# Patient Record
Sex: Female | Born: 1965 | Race: Black or African American | Hispanic: No | Marital: Single | Smoking: Current every day smoker
Health system: Southern US, Community
[De-identification: ages and names within clinical notes are randomized; demographics above are authoritative.]

## PROBLEM LIST (undated history)

## (undated) DIAGNOSIS — D649 Anemia, unspecified: Secondary | ICD-10-CM

## (undated) DIAGNOSIS — N390 Urinary tract infection, site not specified: Secondary | ICD-10-CM

## (undated) DIAGNOSIS — R011 Cardiac murmur, unspecified: Secondary | ICD-10-CM

## (undated) HISTORY — DX: Anemia, unspecified: D64.9

## (undated) HISTORY — DX: Cardiac murmur, unspecified: R01.1

---

## 2006-10-31 ENCOUNTER — Emergency Department (HOSPITAL_COMMUNITY): Admission: EM | Admit: 2006-10-31 | Discharge: 2006-10-31 | Payer: Self-pay | Admitting: Emergency Medicine

## 2009-09-20 ENCOUNTER — Emergency Department (HOSPITAL_COMMUNITY): Admission: EM | Admit: 2009-09-20 | Discharge: 2009-09-20 | Payer: Self-pay | Admitting: Family Medicine

## 2010-10-24 LAB — POCT URINALYSIS DIP (DEVICE)
Glucose, UA: NEGATIVE mg/dL
Ketones, ur: NEGATIVE mg/dL
Protein, ur: NEGATIVE mg/dL
Specific Gravity, Urine: <=1.005 (ref 1.005–1.030)

## 2010-10-24 LAB — URINE CULTURE: Colony Count: 85000

## 2010-10-24 LAB — POCT PREGNANCY, URINE: Preg Test, Ur: NEGATIVE

## 2011-07-15 ENCOUNTER — Emergency Department (INDEPENDENT_AMBULATORY_CARE_PROVIDER_SITE_OTHER): Payer: Medicaid Other

## 2011-07-15 ENCOUNTER — Emergency Department (INDEPENDENT_AMBULATORY_CARE_PROVIDER_SITE_OTHER)
Admission: EM | Admit: 2011-07-15 | Discharge: 2011-07-15 | Disposition: A | Discharge status: Home / Self Care | Payer: Medicaid Other | Source: Home / Self Care | Attending: Family Medicine | Admitting: Family Medicine

## 2011-07-15 ENCOUNTER — Encounter: Payer: Self-pay | Admitting: Cardiology

## 2011-07-15 DIAGNOSIS — J111 Influenza due to unidentified influenza virus with other respiratory manifestations: Secondary | ICD-10-CM

## 2011-07-15 DIAGNOSIS — R05 Cough: Secondary | ICD-10-CM

## 2011-07-15 DIAGNOSIS — N390 Urinary tract infection, site not specified: Secondary | ICD-10-CM

## 2011-07-15 DIAGNOSIS — F17200 Nicotine dependence, unspecified, uncomplicated: Secondary | ICD-10-CM

## 2011-07-15 DIAGNOSIS — R509 Fever, unspecified: Secondary | ICD-10-CM

## 2011-07-15 LAB — POCT URINALYSIS DIP (DEVICE)
Bilirubin Urine: NEGATIVE
Glucose, UA: NEGATIVE mg/dL
Nitrite: NEGATIVE
Specific Gravity, Urine: 1.02 (ref 1.005–1.030)
Urobilinogen, UA: 0.2 mg/dL (ref 0.0–1.0)
pH: 7 (ref 5.0–8.0)

## 2011-07-15 MED ORDER — SULFAMETHOXAZOLE-TMP DS 800-160 MG PO TABS: 1.0000 | ORAL_TABLET | Freq: Two times a day (BID) | ORAL | Status: AC

## 2011-07-15 NOTE — ED Notes (Signed)
Pt reports sore throat, cough, back ache, and  hurting and pressure on urination since Sunday 12/2. Denies fever.

## 2011-07-15 NOTE — ED Provider Notes (Signed)
History     CSN: [id number] Arrival date & time: 07/15/2011 11:13 AM   First MD Initiated Contact with Patient 07/15/11 1125      Chief Complaint  Patient presents with  . Sore Throat  . Cough  . Urinary Tract Infection    (Consider location/radiation/quality/duration/timing/severity/associated sxs/prior treatment) Patient is a 45 y.o. female presenting with URI. The history is provided by the patient.  URI The primary symptoms include sore throat and cough. Primary symptoms do not include fever or rash. The current episode started 3 to 5 days ago. This is a new problem. The problem has not changed since onset. Symptoms associated with the illness include congestion and rhinorrhea. Risk factors: smoker.    History reviewed. No pertinent past medical history.  History reviewed. No pertinent past surgical history.  Family History  Problem Relation Age of Onset  . Asthma Other     History  Substance Use Topics  . Smoking status: Current Everyday Smoker -- 0.5 packs/day  . Smokeless tobacco: Not on file  . Alcohol Use: Yes     occas    OB History    Grav Para Term Preterm Abortions TAB SAB Ect Mult Living                  Review of Systems  Constitutional: Negative for fever.  HENT: Positive for congestion, sore throat and rhinorrhea.   Respiratory: Positive for cough. Negative for shortness of breath.   Gastrointestinal: Negative.   Genitourinary: Positive for dysuria, urgency and frequency. Negative for pelvic pain.  Skin: Negative for rash.    Allergies  Review of patient's allergies indicates no known allergies.  Home Medications   Current Outpatient Rx  Name Route Sig Dispense Refill  . SULFAMETHOXAZOLE-TMP DS 800-160 MG PO TABS Oral Take 1 tablet by mouth 2 (two) times daily. 20 tablet 0    BP 124/81  Pulse 71  Temp(Src) 98.6 F (37 C) (Oral)  Resp 20  SpO2 100%  LMP 07/05/2011  Physical Exam  Nursing note and vitals  reviewed. Constitutional: She appears well-developed and well-nourished.  HENT:  Head: Normocephalic.  Right Ear: External ear normal.  Left Ear: External ear normal.  Mouth/Throat: Oropharynx is clear and moist.  Eyes: Conjunctivae and EOM are normal. Pupils are equal, round, and reactive to light.  Neck: Normal range of motion. Neck supple.  Cardiovascular: Normal rate, normal heart sounds and intact distal pulses.   Pulmonary/Chest: Effort normal. She has rhonchi in the right middle field and the right lower field.  Abdominal: Soft. Bowel sounds are normal. There is no tenderness. There is no guarding.  Lymphadenopathy:    She has no cervical adenopathy.    ED Course  Procedures (including critical care time)  Labs Reviewed  POCT URINALYSIS DIP (DEVICE) - Abnormal; Notable for the following:    Hgb urine dipstick MODERATE (*)    All other components within normal limits  POCT URINALYSIS DIPSTICK   Dg Chest 2 View  07/15/2011  *RADIOLOGY REPORT*  Clinical Data:  Cough, fever and smoker.  CHEST - 2 VIEW  Comparison: None  Findings: The heart size and mediastinal contours are within normal limits.  Both lungs are clear.  The visualized skeletal structures are unremarkable.  IMPRESSION: No active disease.  Original Report Authenticated By: Reola Calkins, M.D.     1. Bronchitis with flu   2. UTI (lower urinary tract infection)       MDM  X-rays reviewed and  report per radiologist. U/a blood--mod.        Barkley Bruns, MD 07/15/11 1215

## 2012-02-03 ENCOUNTER — Emergency Department (HOSPITAL_COMMUNITY)
Admission: EM | Admit: 2012-02-03 | Discharge: 2012-02-03 | Disposition: A | Discharge status: Home / Self Care | Payer: Medicaid Other | Attending: Emergency Medicine | Admitting: Emergency Medicine

## 2012-02-03 ENCOUNTER — Encounter (HOSPITAL_COMMUNITY): Payer: Self-pay | Admitting: Emergency Medicine

## 2012-02-03 DIAGNOSIS — R42 Dizziness and giddiness: Secondary | ICD-10-CM | POA: Insufficient documentation

## 2012-02-03 DIAGNOSIS — N390 Urinary tract infection, site not specified: Secondary | ICD-10-CM | POA: Insufficient documentation

## 2012-02-03 DIAGNOSIS — J3489 Other specified disorders of nose and nasal sinuses: Secondary | ICD-10-CM | POA: Insufficient documentation

## 2012-02-03 DIAGNOSIS — R0981 Nasal congestion: Secondary | ICD-10-CM

## 2012-02-03 HISTORY — DX: Urinary tract infection, site not specified: N39.0

## 2012-02-03 LAB — URINALYSIS, ROUTINE W REFLEX MICROSCOPIC
Bilirubin Urine: NEGATIVE
Glucose, UA: NEGATIVE mg/dL
Specific Gravity, Urine: 1.01 (ref 1.005–1.030)
Urobilinogen, UA: 0.2 mg/dL (ref 0.0–1.0)

## 2012-02-03 LAB — URINE MICROSCOPIC-ADD ON

## 2012-02-03 LAB — POCT I-STAT, CHEM 8
BUN: 6 mg/dL (ref 6–23)
Calcium, Ion: 1.21 mmol/L (ref 1.12–1.32)
Chloride: 104 mEq/L (ref 96–112)
Creatinine, Ser: 0.8 mg/dL (ref 0.50–1.10)
Glucose, Bld: 83 mg/dL (ref 70–99)
Potassium: 3.8 mEq/L (ref 3.5–5.1)

## 2012-02-03 MED ORDER — NITROFURANTOIN MONOHYD MACRO 100 MG PO CAPS: 100.0000 mg | ORAL_CAPSULE | Freq: Two times a day (BID) | ORAL | Status: AC

## 2012-02-03 NOTE — ED Provider Notes (Signed)
Medical screening examination/treatment/procedure(s) were performed by non-physician practitioner and as supervising physician I was immediately available for consultation/collaboration.   Vir Whetstine B. Bernette Mayers, MD 02/03/12 1433

## 2012-02-03 NOTE — ED Notes (Signed)
Pt reports 2 month history of having pressure in vaginal area and urinary frequency. Pt denies burning or odor with urination. Last week on Tuesday pt experienced feeling lightheaded and had left sided ear pain the weekend before.

## 2012-02-03 NOTE — ED Provider Notes (Signed)
History     CSN: [id number]  Arrival date & time 02/03/12  1105   First MD Initiated Contact with Patient 02/03/12 1243      Chief Complaint  Patient presents with  . Dizziness  . Otalgia  . Urinary Frequency    (Consider location/radiation/quality/duration/timing/severity/associated sxs/prior treatment) HPI  Patient presents to emergency department complaining of a one month history of some intermittent pressure and stinging around her urethral opening with urination. She denies fevers, chills, abdominal pain, nausea, vomiting, flank pain, or hematuria. She denies vaginal discharge, pelvic pain, or dyspareunia. Patient is also complaining of a one-week history of intermittent lightheadedness noting that 2 days ago she felt pain in her left ear that has waxed and waned. Patient states she is not currently feel lightheaded or have pain in her ear. She denies fevers, chills, recent illness, sore throat, cough, or chest pain. Patient taken nothing for symptoms prior to arrival. She denies aggravating or alleviating factors. She denies difficulty ambulating, headache, visual changes, or loss of coordination. Patient states she has no known medical problems and takes no medicines on regular basis.  Past Medical History  Diagnosis Date  . UTI (lower urinary tract infection)     Past Surgical History  Procedure Date  . Cesarean section     Family History  Problem Relation Age of Onset  . Asthma Other     History  Substance Use Topics  . Smoking status: Current Everyday Smoker -- 0.5 packs/day  . Smokeless tobacco: Not on file  . Alcohol Use: Yes     occas    OB History    Grav Para Term Preterm Abortions TAB SAB Ect Mult Living                  Review of Systems  All other systems reviewed and are negative.    Allergies  Review of patient's allergies indicates no known allergies.  Home Medications   Current Outpatient Rx  Name Route Sig Dispense Refill  . ADULT  MULTIVITAMIN W/MINERALS CH Oral Take 1 tablet by mouth daily.    Marland Kitchen SWEET OIL OIL Both Ears Place 1 drop into both ears once.      BP 133/86  Pulse 76  Temp 98.9 F (37.2 C) (Oral)  Resp 20  SpO2 100%  LMP 01/28/2012  Physical Exam  Nursing note and vitals reviewed. Constitutional: She is oriented to person, place, and time. She appears well-developed and well-nourished. No distress.  HENT:  Head: Normocephalic and atraumatic.  Right Ear: External ear normal.  Left Ear: External ear normal.  Mouth/Throat: No oropharyngeal exudate.       TM's normal bilaterally  Eyes: Conjunctivae and EOM are normal. Pupils are equal, round, and reactive to light.       No nystagums  Neck: Normal range of motion. Neck supple.  Cardiovascular: Normal rate, regular rhythm, normal heart sounds and intact distal pulses.  Exam reveals no gallop and no friction rub.   No murmur heard. Pulmonary/Chest: Effort normal and breath sounds normal. No respiratory distress. She has no wheezes. She has no rales. She exhibits no tenderness.  Abdominal: Soft. Bowel sounds are normal. She exhibits no distension and no mass. There is no tenderness. There is no rebound and no guarding.       No CVA TTP.   Genitourinary: No vaginal discharge found.       Normal external genitalia.   Musculoskeletal: Normal range of motion. She exhibits no edema  and no tenderness.  Neurological: She is alert and oriented to person, place, and time. No cranial nerve deficit. Coordination normal.  Skin: Skin is warm and dry. No rash noted. She is not diaphoretic. No erythema.  Psychiatric: She has a normal mood and affect.    ED Course  Procedures (including critical care time)  Labs Reviewed  URINALYSIS, ROUTINE W REFLEX MICROSCOPIC - Abnormal; Notable for the following:    Hgb urine dipstick MODERATE (*)     All other components within normal limits  URINE MICROSCOPIC-ADD ON  URINE CULTURE   No results found.   1. UTI (lower  urinary tract infection)   2. Sinus congestion       MDM  Patient's urine does not look obviously infected however we will send it for culture. Given that she is symptomatic I will treat for urinary tract infection. She has no signs or symptoms of pyelonephritis. She's afebrile nontoxic-appearing. Patient is complaining of intermittent lightheadedness and some pain in her left ear that waxes and wanes. She has no ataxia no neuro focal deficits. TMs normal bilaterally. Question sinus pressure and congestion causing the lightheadedness and a waxing waning pain in her left ear. I spoke with patient about symptomatic treatment. She voices understanding and is agreeable to close PCP follow up for recheck of ongoing symptoms but returning to ER for any emergent changing or worsening of symtpoms.         [place], [place] 02/03/12 1432

## 2012-02-04 LAB — URINE CULTURE

## 2013-01-06 ENCOUNTER — Emergency Department (HOSPITAL_COMMUNITY)
Admission: EM | Admit: 2013-01-06 | Discharge: 2013-01-06 | Disposition: A | Discharge status: Home / Self Care | Payer: Medicaid Other | Source: Home / Self Care | Attending: Emergency Medicine | Admitting: Emergency Medicine

## 2013-01-06 ENCOUNTER — Encounter (HOSPITAL_COMMUNITY): Payer: Self-pay | Admitting: Emergency Medicine

## 2013-01-06 DIAGNOSIS — K0889 Other specified disorders of teeth and supporting structures: Secondary | ICD-10-CM

## 2013-01-06 DIAGNOSIS — K089 Disorder of teeth and supporting structures, unspecified: Secondary | ICD-10-CM

## 2013-01-06 MED ORDER — TRAMADOL HCL 50 MG PO TABS: 50.0000 mg | ORAL_TABLET | Freq: Four times a day (QID) | ORAL | Status: DC | PRN

## 2013-01-06 MED ORDER — NAPROXEN 500 MG PO TABS: 500.0000 mg | ORAL_TABLET | Freq: Two times a day (BID) | ORAL | Status: DC

## 2013-01-06 NOTE — ED Provider Notes (Signed)
Medical screening examination/treatment/procedure(s) were performed by non-physician practitioner and as supervising physician I was immediately available for consultation/collaboration.  Leslee Home, M.D.  Reuben Likes, MD 01/06/13 2101

## 2013-01-06 NOTE — ED Notes (Signed)
Pt c/o dental pain x 3 days. Pt has taken tylenol with no relief.  Left and right back teeth are bad. Pt states pain is a throbbing sensation. Some mild swelling. Denies any other symptoms.

## 2013-01-06 NOTE — ED Provider Notes (Signed)
History     CSN: [id number]  Arrival date & time 01/06/13  1600   First MD Initiated Contact with Patient 01/06/13 1758      Chief Complaint  Patient presents with  . Dental Pain    left and right back tooth pain x 3 days.     (Consider location/radiation/quality/duration/timing/severity/associated sxs/prior treatment) HPI Comments: Pt has dental pain for a few days.  Her teeth broke about a year ago but she never went to dentist.  She called one and they couldn't see her until Tuesday so pt states they told her to come here.  No fever, chills, bad taste, swelling, or discharge   Patient is a 47 y.o. female presenting with tooth pain.  Dental Pain Associated symptoms: no facial swelling, no fever and no neck pain     Past Medical History  Diagnosis Date  . UTI (lower urinary tract infection)     Past Surgical History  Procedure Laterality Date  . Cesarean section      Family History  Problem Relation Age of Onset  . Asthma Other     History  Substance Use Topics  . Smoking status: Current Every Day Smoker -- 0.50 packs/day  . Smokeless tobacco: Not on file  . Alcohol Use: Yes     Comment: occas    OB History   Grav Para Term Preterm Abortions TAB SAB Ect Mult Living                  Review of Systems  Constitutional: Negative for fever and chills.  HENT: Positive for dental problem. Negative for facial swelling, neck pain and neck stiffness.   Eyes: Negative for visual disturbance.  Respiratory: Negative for cough and shortness of breath.   Cardiovascular: Negative for chest pain, palpitations and leg swelling.  Gastrointestinal: Negative for nausea, vomiting and abdominal pain.  Endocrine: Negative for polydipsia and polyuria.  Genitourinary: Negative for dysuria, urgency and frequency.  Musculoskeletal: Negative for myalgias and arthralgias.  Skin: Negative for rash.  Neurological: Negative for dizziness, weakness and light-headedness.    Allergies   Review of patient's allergies indicates no known allergies.  Home Medications   Current Outpatient Rx  Name  Route  Sig  Dispense  Refill  . Multiple Vitamin (MULTIVITAMIN WITH MINERALS) TABS   Oral   Take 1 tablet by mouth daily.         . naproxen (NAPROSYN) 500 MG tablet   Oral   Take 1 tablet (500 mg total) by mouth 2 (two) times daily.   60 tablet   0   . Olive Oil (SWEET OIL) OIL   Both Ears   Place 1 drop into both ears once.         . traMADol (ULTRAM) 50 MG tablet   Oral   Take 1 tablet (50 mg total) by mouth every 6 (six) hours as needed for pain.   30 tablet   0     BP 154/80  Pulse 63  Temp(Src) 98.7 F (37.1 C) (Oral)  Resp 16  SpO2 100%  LMP 01/02/2013  Physical Exam  Nursing note and vitals reviewed. Constitutional: She is oriented to person, place, and time. Vital signs are normal. She appears well-developed and well-nourished. No distress.  HENT:  Head: Atraumatic.  Mouth/Throat: Abnormal dentition. Dental caries present.  Eyes: EOM are normal. Pupils are equal, round, and reactive to light.  Cardiovascular: Normal rate, regular rhythm and normal heart sounds.  Exam reveals  no gallop and no friction rub.   No murmur heard. Pulmonary/Chest: Effort normal and breath sounds normal. No respiratory distress. She has no wheezes. She has no rales.  Abdominal: Soft. There is no tenderness.  Neurological: She is alert and oriented to person, place, and time. She has normal strength.  Skin: Skin is warm and dry. She is not diaphoretic.  Psychiatric: She has a normal mood and affect. Her behavior is normal. Judgment normal.    ED Course  Procedures (including critical care time)  Labs Reviewed - No data to display No results found.   1. Toothache       MDM   No evidence of infection or abscess.  Will treat for pain and refer to dentist     Meds ordered this encounter  Medications  . DISCONTD: naproxen (NAPROSYN) 500 MG tablet     Sig: Take 1 tablet (500 mg total) by mouth 2 (two) times daily.    Dispense:  60 tablet    Refill:  0  . DISCONTD: traMADol (ULTRAM) 50 MG tablet    Sig: Take 1 tablet (50 mg total) by mouth every 6 (six) hours as needed for pain.    Dispense:  30 tablet    Refill:  0  . naproxen (NAPROSYN) 500 MG tablet    Sig: Take 1 tablet (500 mg total) by mouth 2 (two) times daily.    Dispense:  60 tablet    Refill:  0  . traMADol (ULTRAM) 50 MG tablet    Sig: Take 1 tablet (50 mg total) by mouth every 6 (six) hours as needed for pain.    Dispense:  30 tablet    Refill:  0          Graylon Good, PA-C 01/06/13 1927

## 2014-09-20 ENCOUNTER — Emergency Department (INDEPENDENT_AMBULATORY_CARE_PROVIDER_SITE_OTHER)
Admission: EM | Admit: 2014-09-20 | Discharge: 2014-09-20 | Disposition: A | Discharge status: Home / Self Care | Payer: Self-pay | Source: Home / Self Care | Attending: Family Medicine | Admitting: Family Medicine

## 2014-09-20 ENCOUNTER — Encounter (HOSPITAL_COMMUNITY): Payer: Self-pay | Admitting: Emergency Medicine

## 2014-09-20 DIAGNOSIS — R220 Localized swelling, mass and lump, head: Secondary | ICD-10-CM

## 2014-09-20 DIAGNOSIS — K047 Periapical abscess without sinus: Secondary | ICD-10-CM

## 2014-09-20 MED ORDER — CLINDAMYCIN HCL 300 MG PO CAPS: 300.0000 mg | ORAL_CAPSULE | Freq: Three times a day (TID) | ORAL | Status: DC

## 2014-09-20 MED ORDER — DICLOFENAC SODIUM 50 MG PO TBEC: 50.0000 mg | DELAYED_RELEASE_TABLET | Freq: Two times a day (BID) | ORAL | Status: DC | PRN

## 2014-09-20 NOTE — Discharge Instructions (Signed)
Thank you for coming in today. Follow up with a Dentist ASAP.    Dental Abscess A dental abscess is a collection of infected fluid (pus) from a bacterial infection in the inner part of the tooth (pulp). It usually occurs at the end of the tooth's root.  CAUSES   Severe tooth decay.  Trauma to the tooth that allows bacteria to enter into the pulp, such as a broken or chipped tooth. SYMPTOMS   Severe pain in and around the infected tooth.  Swelling and redness around the abscessed tooth or in the mouth or face.  Tenderness.  Pus drainage.  Bad breath.  Bitter taste in the mouth.  Difficulty swallowing.  Difficulty opening the mouth.  Nausea.  Vomiting.  Chills.  Swollen neck glands. DIAGNOSIS   A medical and dental history will be taken.  An examination will be performed by tapping on the abscessed tooth.  X-rays may be taken of the tooth to identify the abscess. TREATMENT The goal of treatment is to eliminate the infection. You may be prescribed antibiotic medicine to stop the infection from spreading. A root canal may be performed to save the tooth. If the tooth cannot be saved, it may be pulled (extracted) and the abscess may be drained.  HOME CARE INSTRUCTIONS  Only take over-the-counter or prescription medicines for pain, fever, or discomfort as directed by your caregiver.  Rinse your mouth (gargle) often with salt water ( tsp salt in 8 oz [250 ml] of warm water) to relieve pain or swelling.  Do not drive after taking pain medicine (narcotics).  Do not apply heat to the outside of your face.  Return to your dentist for further treatment as directed. SEEK MEDICAL CARE IF:  Your pain is not helped by medicine.  Your pain is getting worse instead of better. SEEK IMMEDIATE MEDICAL CARE IF:  You have a fever or persistent symptoms for more than 2-3 days.  You have a fever and your symptoms suddenly get worse.  You have chills or a very bad  headache.  You have problems breathing or swallowing.  You have trouble opening your mouth.  You have swelling in the neck or around the eye. Document Released: 07/21/2005 Document Revised: 04/14/2012 Document Reviewed: 10/29/2010 [facility] Patient Information 2015 [place], [place]. This information is not intended to replace advice given to you by your health care provider. Make sure you discuss any questions you have with your health care provider.   Proofreader Services:  [facility] [phone] (ext [phone])  [id number]  Please call Dr. Lawrence Marseilles office [phone] or cell [phone] [address] [place]  Cost for tooth removal $200 includes exam, Xray, and extraction and follow up visit.  Bring list of current medications with you.   [facility] - [address] [phone]  [address] [facility]  [address] [place], [place], [place], [address]  [phone], Ext. [phone]  2nd and 4th Thursday of the month at 6:30am (Simple extractions only - no wisdom teeth or surgery) First come/First serve -First 10 clients served  [facility] [place], [place] and [place] residents only)  [address] [place], [place], [address]  336 [phone]  [facility] Department  336 [phone]  [facility] Health Department  [phone]  [facility] - [facility]  [phone]  Please call [facility] at [phone] to get the  details to get your tooth pulled.

## 2014-09-20 NOTE — ED Provider Notes (Signed)
Shelly Turner is a 49 y.o. female who presents to Urgent Care today for left facial swelling. Patient had a filling fall out of one of her upper left teeth a few months ago. She's had pain off and on over the past month worsening over the past 2 days. She developed upper left sided facial swelling and pain. No fevers or chills. No blurry vision or diplopia. No vomiting or diarrhea. Patient has tried some ibuprofen which helps.   Past Medical History  Diagnosis Date  . UTI (lower urinary tract infection)    Past Surgical History  Procedure Laterality Date  . Cesarean section     History  Substance Use Topics  . Smoking status: Current Every Day Smoker -- 0.50 packs/day  . Smokeless tobacco: Not on file  . Alcohol Use: Yes     Comment: occas   ROS as above Medications: No current facility-administered medications for this encounter.   Current Outpatient Prescriptions  Medication Sig Dispense Refill  . clindamycin (CLEOCIN) 300 MG capsule Take 1 capsule (300 mg total) by mouth 3 (three) times daily. 30 capsule 0  . diclofenac (VOLTAREN) 50 MG EC tablet Take 1 tablet (50 mg total) by mouth 2 (two) times daily as needed. 30 tablet 0  . Multiple Vitamin (MULTIVITAMIN WITH MINERALS) TABS Take 1 tablet by mouth daily.    Gracelyn Nurse. Olive Oil (SWEET OIL) OIL Place 1 drop into both ears once.     No Known Allergies   Exam:  BP 142/84 mmHg  Pulse 81  Temp(Src) 98.1 F (36.7 C) (Oral)  Resp 18  SpO2 98%  LMP 09/12/2014 Gen: Well NAD HEENT: EOMI,  MMM poor dentition throughout. Upper left premolar with dental caries. Tender to touch. Diffuse mild upper left facial swelling. Mildly tender.  Full eye range of motion. No blurry vision during EOM exam Lungs: Normal work of breathing. CTABL Heart: RRR no MRG Abd: NABS, Soft. Nondistended, Nontender Exts: Brisk capillary refill, warm and well perfused.   No results found for this or any previous visit (from the past 24 hour(s)). No results  found.  Assessment and Plan: [age] y.o. female with dental infection with facial infection. Treat with clindamycin and diclofenac. Follow-up with a dentist.  Discussed warning signs or symptoms. Please see discharge instructions. Patient expresses understanding.     Rodolph BongEvan S Malissa Slay, MD 09/20/14 [phone]

## 2014-09-20 NOTE — ED Notes (Signed)
C/o left side facial swelling onset yest am when she woke up Reports dental filling fell off and now having dental pain Dr. Denyse Amassorey is in room w/pt Alert, no signs of acute distress.

## 2017-12-12 ENCOUNTER — Emergency Department (HOSPITAL_COMMUNITY): Payer: Commercial Managed Care - PPO

## 2017-12-12 ENCOUNTER — Encounter (HOSPITAL_COMMUNITY): Payer: Self-pay

## 2017-12-12 ENCOUNTER — Emergency Department (HOSPITAL_COMMUNITY)
Admission: EM | Admit: 2017-12-12 | Discharge: 2017-12-12 | Disposition: A | Discharge status: Home / Self Care | Payer: Commercial Managed Care - PPO | Attending: Emergency Medicine | Admitting: Emergency Medicine

## 2017-12-12 ENCOUNTER — Other Ambulatory Visit: Payer: Self-pay

## 2017-12-12 DIAGNOSIS — M545 Low back pain, unspecified: Secondary | ICD-10-CM

## 2017-12-12 DIAGNOSIS — F17210 Nicotine dependence, cigarettes, uncomplicated: Secondary | ICD-10-CM | POA: Insufficient documentation

## 2017-12-12 DIAGNOSIS — M546 Pain in thoracic spine: Secondary | ICD-10-CM

## 2017-12-12 DIAGNOSIS — M549 Dorsalgia, unspecified: Secondary | ICD-10-CM | POA: Diagnosis present

## 2017-12-12 LAB — POC URINE PREG, ED: PREG TEST UR: NEGATIVE

## 2017-12-12 MED ORDER — ACETAMINOPHEN 500 MG PO TABS
1000.0000 mg | ORAL_TABLET | Freq: Once | ORAL | Status: AC
  Administered 2017-12-12: 1000 mg via ORAL
  Filled 2017-12-12: qty 2

## 2017-12-12 MED ORDER — IBUPROFEN 400 MG PO TABS
600.0000 mg | ORAL_TABLET | Freq: Once | ORAL | Status: AC
  Administered 2017-12-12: 600 mg via ORAL
  Filled 2017-12-12: qty 1

## 2017-12-12 MED ORDER — METHOCARBAMOL 500 MG PO TABS: 500.0000 mg | ORAL_TABLET | Freq: Two times a day (BID) | ORAL | 0 refills | Status: DC
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
## 2020-01-13 ENCOUNTER — Other Ambulatory Visit: Payer: Self-pay

## 2020-01-13 ENCOUNTER — Ambulatory Visit (INDEPENDENT_AMBULATORY_CARE_PROVIDER_SITE_OTHER): Payer: Commercial Managed Care - PPO

## 2020-01-13 ENCOUNTER — Encounter (HOSPITAL_COMMUNITY): Payer: Self-pay

## 2020-01-13 ENCOUNTER — Ambulatory Visit (HOSPITAL_COMMUNITY)
Admission: EM | Admit: 2020-01-13 | Discharge: 2020-01-13 | Disposition: A | Discharge status: Home / Self Care | Payer: Commercial Managed Care - PPO | Attending: Family Medicine | Admitting: Family Medicine

## 2020-01-13 DIAGNOSIS — M5010 Cervical disc disorder with radiculopathy, unspecified cervical region: Secondary | ICD-10-CM

## 2020-01-13 DIAGNOSIS — M542 Cervicalgia: Secondary | ICD-10-CM

## 2020-01-13 DIAGNOSIS — R202 Paresthesia of skin: Secondary | ICD-10-CM

## 2020-01-13 MED ORDER — TIZANIDINE HCL 4 MG PO TABS: 4.0000 mg | ORAL_TABLET | Freq: Four times a day (QID) | ORAL | 0 refills | Status: DC | PRN

## 2020-01-13 MED ORDER — MELOXICAM 15 MG PO TABS
15.0000 mg | ORAL_TABLET | Freq: Every day | ORAL | 0 refills | Status: DC
Start: 2020-01-13 — End: 2023-08-27

## 2020-01-13 MED ORDER — METHYLPREDNISOLONE 4 MG PO TBPK
ORAL_TABLET | ORAL | 0 refills | Status: DC
Start: 2020-01-13 — End: 2023-08-27

## 2020-01-13 NOTE — ED Provider Notes (Signed)
[facility]    CSN: [id number] Arrival date & time: 01/13/20  2979      History   Chief Complaint Chief Complaint  Patient presents with  . left arm/back tingling    HPI Shelly Turner is a 54 y.o. female.   HPI  Patient is here for neck pain and left arm numbness.  This has been going on for couple of months.  She has numbness that goes down her left arm to her middle 2 fingers long and ring, on the dorsum of the hand.  No numbness, no aching.  The pain comes and goes.  It feels like electric shock at times.  It is worse with use of arm and with certain movements of the neck.  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  Cannot always use good body mechanics.  There is no specific accident or injury.  She states that she had a similar episode about 2 years ago.  She was treated by a chiropractor and got better. Patient states she is otherwise in good health and on no medication   Past Medical History:  Diagnosis Date  . UTI (lower urinary tract infection)     There are no problems to display for this patient.   Past Surgical History:  Procedure Laterality Date  . CESAREAN SECTION      OB History   No obstetric history on file.   Patient has 3 living children.  Age 14, 64 and 84.  Home Medications    Prior to Admission medications   Medication Sig Start Date End Date Taking? Authorizing Provider  Multiple Vitamin (MULTIVITAMIN WITH MINERALS) TABS Take 1 tablet by mouth daily.    [provider]  Olive Oil (SWEET OIL) OIL Place 1 drop into both ears once.    [provider]    Family History Family History  Problem Relation Age of Onset  . Asthma Other     Social History Social History   Tobacco Use  . Smoking status: Current Every Day Smoker    Packs/day: 0.50  Substance Use Topics  . Alcohol use: Yes    Comment: occas  . Drug use: No     Allergies   Patient has no known allergies.   Review of  Systems Review of Systems  Musculoskeletal: Positive for neck pain and neck stiffness.     Physical Exam Triage Vital Signs ED Triage Vitals  Enc Vitals Group     BP 01/13/20 0930 (!) 148/86     Pulse Rate 01/13/20 0930 79     Resp 01/13/20 0930 16     Temp 01/13/20 0930 98.6 F (37 C)     Temp Source 01/13/20 0930 Oral     SpO2 01/13/20 0930 100 %     Weight 01/13/20 0931 173 lb (78.5 kg)     Height 01/13/20 0931 5\' 8"  (1.727 m)     Head Circumference --      Peak Flow --      Pain Score 01/13/20 0931 8     Pain Loc --      Pain Edu? --      Excl. in GC? --    No data found.  Updated Vital Signs BP (!) 148/86   Pulse 79   Temp 98.6 F (37 C) (Oral)   Resp 16   Ht 5\' 8"  (1.727 m)   Wt 78.5 kg   LMP 01/07/2020  SpO2 100%   BMI 26.30 kg/m   Physical Exam Constitutional:      General: She is in acute distress.     Appearance: She is well-developed and normal weight.     Comments: Appears uncomfortable.  Stiff guarded movements  HENT:     Head: Normocephalic and atraumatic.     Mouth/Throat:     Comments: Mask is in place Eyes:     Conjunctiva/sclera: Conjunctivae normal.     Pupils: Pupils are equal, round, and reactive to light.  Neck:     Comments: Tenderness of the left of the C6-7 region and on the medial border of the scapula.  Increased muscle tone in the left upper body the trapezius.  Full but slow range of motion of neck Cardiovascular:     Rate and Rhythm: Normal rate.  Pulmonary:     Effort: Pulmonary effort is normal. No respiratory distress.  Abdominal:     General: There is no distension.     Palpations: Abdomen is soft.  Musculoskeletal:        General: Normal range of motion.     Cervical back: Normal range of motion. Tenderness present.     Comments: Strength sensation range of motion reflexes normal in both upper extremities  Skin:    General: Skin is warm and dry.  Neurological:     General: No focal deficit present.     Mental  Status: She is alert.  Psychiatric:        Mood and Affect: Mood normal.        Behavior: Behavior normal.      UC Treatments / Results  Labs (all labs ordered are listed, but only abnormal results are displayed) Labs Reviewed - No data to display  EKG   Radiology DG Cervical Spine Complete  Result Date: 01/13/2020 CLINICAL DATA:  Left neck pain and radiculopathy over the last week. Left arm tingling. EXAM: CERVICAL SPINE - COMPLETE 4+ VIEW COMPARISON:  CT 12/08/2017 FINDINGS: Straightening of the normal cervical lordosis. Mild disc space narrowing with small endplate osteophytes at C3-4, C4-5 and C5-6. Moderate bony foraminal narrowing on sides at C4-5 and C5-6. IMPRESSION: Mid cervical spondylosis. The patient does have some bony foraminal narrowing on both sides at C4-5 and C5-6. Electronically Signed   By: Paulina Fusi M.D.   On: 01/13/2020 11:20    Procedures Procedures (including critical care time)  Medications Ordered in UC Medications - No data to display  Initial Impression / Assessment and Plan / UC Course  I have reviewed the triage vital signs and the nursing notes.  Pertinent labs & imaging results that were available during my care of the patient were reviewed by me and considered in my medical decision making (see chart for details).     Discussed x-ray results and cervical DJD with patient Final Clinical Impressions(s) / UC Diagnoses   Final diagnoses:  Cervical disc disorder with radiculopathy of cervical region     Discharge Instructions     Take the Medrol Dosepak as directed This is a steroid anti-inflammatory to reduce pinched nerve symptoms When you have completed the Medrol Dosepak start meloxicam once a day This is a nonsteroid anti-inflammatory to take daily Take the tizanidine as needed as muscle relaxer This is especially helpful at night Use ice or heat to painful neck area See sports medicine if you fail to improve    ED  Prescriptions    Medication Sig Dispense Auth. Provider  methylPREDNISolone (MEDROL DOSEPAK) 4 MG TBPK tablet TAD 21 tablet Raylene Everts, MD   meloxicam (MOBIC) 15 MG tablet Take 1 tablet (15 mg total) by mouth daily. 30 tablet Raylene Everts, MD   tiZANidine (ZANAFLEX) 4 MG tablet Take 1-2 tablets (4-8 mg total) by mouth every 6 (six) hours as needed for muscle spasms. 21 tablet Raylene Everts, MD     PDMP not reviewed this encounter.   Raylene Everts, MD 01/13/20 1126

## 2020-01-13 NOTE — ED Triage Notes (Signed)
PT c/o intermittent left arm and back tingling and left fingers got numbx2 mos, but has gotten more consistent in the last 2wks. Pt denies any other symptoms. 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

## 2020-01-13 NOTE — Discharge Instructions (Signed)
Take the Medrol Dosepak as directed This is a steroid anti-inflammatory to reduce pinched nerve symptoms When you have completed the Medrol Dosepak start meloxicam once a day This is a nonsteroid anti-inflammatory to take daily Take the tizanidine as needed as muscle relaxer This is especially helpful at night Use ice or heat to painful neck area See sports medicine if you fail to improve

## 2020-02-02 ENCOUNTER — Ambulatory Visit (INDEPENDENT_AMBULATORY_CARE_PROVIDER_SITE_OTHER): Payer: Commercial Managed Care - PPO | Admitting: Pediatrics

## 2020-02-02 ENCOUNTER — Other Ambulatory Visit: Payer: Self-pay

## 2020-02-02 VITALS — BP 134/90 | Ht 68.0 in

## 2020-02-02 DIAGNOSIS — M542 Cervicalgia: Secondary | ICD-10-CM | POA: Diagnosis not present

## 2020-02-02 MED ORDER — GABAPENTIN 100 MG PO CAPS
100.0000 mg | ORAL_CAPSULE | Freq: Every day | ORAL | 1 refills | Status: DC
Start: 2020-02-02 — End: 2023-08-27

## 2020-02-02 NOTE — Patient Instructions (Addendum)
You have pain caused from a nerve root getting compressed likely by a disc We will try a medication called gabapentin (neurontin) to see if this helps with your pain. Start to 100 mg before bed. You can increase to 200 mg in 5-7 days if you would like and then you can increase again to 300 mg 5-7 days after that. We hope this helps! The main thing that should help is PT! Return in 4-6 weeks  Cervical Radiculopathy  Cervical radiculopathy means that a nerve in the neck (a cervical nerve) is pinched or bruised. This can happen because of an injury to the cervical spine (vertebrae) in the neck, or as a normal part of getting older. This can cause pain or loss of feeling (numbness) that runs from your neck all the way down to your arm and fingers. Often, this condition gets better with rest. Treatment may be needed if the condition does not get better. What are the causes?  A neck injury.  A bulging disk in your spine.  Muscle movements that you cannot control (muscle spasms).  Tight muscles in your neck due to overuse.  Arthritis.  Breakdown in the bones and joints of the spine (spondylosis) due to getting older.  Bone spurs that form near the nerves in the neck. What are the signs or symptoms?  Pain. The pain may: ? Run from the neck to the arm and hand. ? Be very bad or irritating. ? Be worse when you move your neck.  Loss of feeling or tingling in your arm or hand.  Weakness in your arm or hand, in very bad cases. How is this treated? In many cases, treatment is not needed for this condition. With rest, the condition often gets better over time. If treatment is needed, options may include:  Wearing a soft neck collar (cervical collar) for short periods of time, as told by your doctor.  Doing exercises (physical therapy) to strengthen your neck muscles.  Taking medicines.  Having shots (injections) in your spine, in very bad cases.  Having surgery. This may be needed  if other treatments do not help. The type of surgery that is used depends on the cause of your condition. Follow these instructions at home: If you have a soft neck collar:  Wear it as told by your doctor. Remove it only as told by your doctor.  Ask your doctor if you can remove the collar for cleaning and bathing. If you are allowed to remove the collar for cleaning or bathing: ? Follow instructions from your doctor about how to remove the collar safely. ? Clean the collar by wiping it with mild soap and water and drying it completely. ? Take out any removable pads in the collar every 1-2 days. Wash them by hand with soap and water. Let them air-dry completely before you put them back in the collar. ? Check your skin under the collar for redness or sores. If you see any, tell your doctor. Managing pain      Take over-the-counter and prescription medicines only as told by your doctor.  If told, put ice on the painful area. ? If you have a soft neck collar, remove it as told by your doctor. ? Put ice in a plastic bag. ? Place a towel between your skin and the bag. ? Leave the ice on for 20 minutes, 2-3 times a day.  If using ice does not help, you can try using heat. Use the  heat source that your doctor recommends, such as a moist heat pack or a heating pad. ? Place a towel between your skin and the heat source. ? Leave the heat on for 20-30 minutes. ? Remove the heat if your skin turns bright red. This is very important if you are unable to feel pain, heat, or cold. You may have a greater risk of getting burned.  You may try a gentle neck and shoulder rub (massage). Activity  Rest as needed.  Return to your normal activities as told by your doctor. Ask your doctor what activities are safe for you.  Do exercises as told by your doctor or physical therapist.  Do not lift anything that is heavier than 10 lb (4.5 kg) until your doctor tells you that it is safe. General  instructions  Use a flat pillow when you sleep.  Do not drive while wearing a soft neck collar. If you do not have a soft neck collar, ask your doctor if it is safe to drive while your neck heals.  Ask your doctor if the medicine prescribed to you requires you to avoid driving or using heavy machinery.  Do not use any products that contain nicotine or tobacco, such as cigarettes, e-cigarettes, and chewing tobacco. These can delay healing. If you need help quitting, ask your doctor.  Keep all follow-up visits as told by your doctor. This is important. Contact a doctor if:  Your condition does not get better with treatment. Get help right away if:  Your pain gets worse and is not helped with medicine.  You lose feeling or feel weak in your hand, arm, face, or leg.  You have a high fever.  You have a stiff neck.  You cannot control when you poop or pee (have incontinence).  You have trouble with walking, balance, or talking. Summary  Cervical radiculopathy means that a nerve in the neck is pinched or bruised.  A nerve can get pinched from a bulging disk, arthritis, an injury to the neck, or other causes.  Symptoms include pain, tingling, or loss of feeling that goes from the neck into the arm or hand.  Weakness in your arm or hand can happen in very bad cases.  Treatment may include resting, wearing a soft neck collar, and doing exercises. You might need to take medicines for pain. In very bad cases, shots or surgery may be needed.

## 2020-02-02 NOTE — Progress Notes (Signed)
  Shelly Turner - 54 y.o. female MRN [id number]  Date of birth: [birth year]  SUBJECTIVE:   CC: left sided numbness, tingling, spasms  54 year old female presenting with numbness, tingling into left arm with accompanying muscle spasms.  She reports that she has had these symptoms for the past 4 to 5 months.  She was seen at an urgent care on 6/11 and had x-rays of her cervical spine that showed foraminal narrowing at C4-C5 and C5-C6.  She was diagnosed with cervicalgia and was given a prednisone Dosepak and tizanidine.  Patient reports that the prednisone helped a lot but the tizanidine does not help with muscle spasms.  She reports numbness and tingling into the dorsum of her third and fourth fingers as well as lateral arm pain and muscle spasms.  She also feels muscle spasms in her chest and shoulder.  She had similar symptoms of numbness and tingling about 12 years ago and this improved with manipulation by a chiropractor.  She has not noticed any weakness in her arm.  She was involved in [facility] in May 2019 that caused neck and back pain but no severe injury.    ROS: As above  PMHx - Updated and reviewed.  Contributory factors include: as above PSHx - Updated and reviewed.  Contributory factors include:  Negative FHx - Updated and reviewed.  Contributory factors include:  Negative Social Hx - Updated and reviewed. Contributory factors include: Negative Medications - reviewed   DATA REVIEWED: Urgent care records and cervical x-rays  PHYSICAL EXAM:  VS: BP:134/90  HR: bpm  TEMP: ( )  RESP:   HT:5\' 8"  (172.7 cm)   WT:   BMI:  PHYSICAL EXAM: Gen: NAD, alert, cooperative with exam, well-appearing HEENT: clear conjunctiva,  CV:  no edema, capillary refill brisk, normal rate Resp: non-labored Skin: no rashes, normal turgor  Neuro: no gross deficits.  Psych:  alert and oriented  Neck/Back: - Inspection: no gross deformity or asymmetry, swelling or ecchymosis - Palpation: no TTP along  spinous process, TTP along rhomboid and lateral trapezius with pressure point at  Lateral trapezius and deltoid-- very sensitive to touch - ROM: limited ROM of the cervical spine with neck extension, rotation, flexion- neck extension causes reproduction of numbness/tingling and cramping sensation in arm and chest - Strength: 5/5 wrist flexion, extension, biceps flexion. 5/5 triceps extension. OK sign, interosseus strength intact  - Neuro: numbness in C7 nerve distribution (lateral shoulder/dorsum forearm and dorsum of 3rd and 4th digits) - Special testing: positive slump test, positive spurling's   ASSESSMENT & PLAN:  54 year old female presenting with cervicalgia with left upper extremity radiculopathy, with symptoms mostly in the C7 dermatomal distribution.  She has a positive Spurling's likely from a disc impinging on cervical nerves.  [facility].  Will trial gabapentin to see if this helps with her pain and allows her to get some rest, starting with 100 mg nightly and increasing up to 300 mg if it is helping.  Instructed her to return in 4 weeks for reevaluation.  If she is not getting improvement, will obtain cervical MRI and possible epidural steroid injection for pain relief.

## 2020-04-09 ENCOUNTER — Encounter (HOSPITAL_COMMUNITY): Payer: Self-pay

## 2020-04-09 ENCOUNTER — Ambulatory Visit (HOSPITAL_COMMUNITY)
Admission: EM | Admit: 2020-04-09 | Discharge: 2020-04-09 | Disposition: A | Discharge status: Home / Self Care | Payer: Commercial Managed Care - PPO | Attending: Family Medicine | Admitting: Family Medicine

## 2020-04-09 ENCOUNTER — Other Ambulatory Visit: Payer: Self-pay

## 2020-04-09 DIAGNOSIS — J069 Acute upper respiratory infection, unspecified: Secondary | ICD-10-CM | POA: Diagnosis not present

## 2020-04-09 DIAGNOSIS — U071 COVID-19: Secondary | ICD-10-CM | POA: Insufficient documentation

## 2020-04-09 LAB — SARS CORONAVIRUS 2 (TAT 6-24 HRS): SARS Coronavirus 2: POSITIVE — AB

## 2020-04-09 NOTE — ED Triage Notes (Signed)
Pt is here with body aches and chills that started Saturday night, pt has taken Tylenol to relieve discomfort

## 2020-04-09 NOTE — ED Provider Notes (Signed)
[facility]    CSN: [id number] Arrival date & time: 04/09/20  0815      History   Chief Complaint Chief Complaint  Patient presents with  . Generalized Body Aches  . Chills    HPI Shelly Turner is a 54 y.o. female.   3 days of generalized body aches, headaches, chills, sweats, post nasal drainage, loss of appetite. Trying to stay hydrated, denies abdominal pain, N/V/D, rashes, cough, wheezing, SOB. Taking tylenol prn with minimal temporary relief. Daughter has a cold but otherwise no sick contacts known. Not UTD on COVID vaccines.      Past Medical History:  Diagnosis Date  . UTI (lower urinary tract infection)     There are no problems to display for this patient.   Past Surgical History:  Procedure Laterality Date  . CESAREAN SECTION      OB History   No obstetric history on file.      Home Medications    Prior to Admission medications   Medication Sig Start Date End Date Taking? Authorizing Provider  gabapentin (NEURONTIN) 100 MG capsule Take 1 capsule (100 mg total) by mouth at bedtime. 02/02/20   Marca Ancona, MD  meloxicam (MOBIC) 15 MG tablet Take 1 tablet (15 mg total) by mouth daily. 01/13/20   Eustace Moore, MD  methylPREDNISolone (MEDROL DOSEPAK) 4 MG TBPK tablet TAD Patient not taking: Reported on 02/02/2020 01/13/20   Eustace Moore, MD  Multiple Vitamin (MULTIVITAMIN WITH MINERALS) TABS Take 1 tablet by mouth daily.    [provider]  tiZANidine (ZANAFLEX) 4 MG tablet Take 1-2 tablets (4-8 mg total) by mouth every 6 (six) hours as needed for muscle spasms. 01/13/20   Eustace Moore, MD    Family History Family History  Problem Relation Age of Onset  . Asthma Other   . Asthma Mother   . Healthy Father     Social History Social History   Tobacco Use  . Smoking status: Current Every Day Smoker    Packs/day: 0.50  . Smokeless tobacco: Never Used  Substance Use Topics  . Alcohol use: Yes    Comment:  occas  . Drug use: No     Allergies   Patient has no known allergies.   Review of Systems Review of Systems PER HPI  Physical Exam Triage Vital Signs ED Triage Vitals  Enc Vitals Group     BP 04/09/20 0859 140/80     Pulse Rate 04/09/20 0859 98     Resp 04/09/20 0859 18     Temp 04/09/20 0859 99.1 F (37.3 C)     Temp Source 04/09/20 0859 Oral     SpO2 04/09/20 0859 100 %     Weight --      Height --      Head Circumference --      Peak Flow --      Pain Score 04/09/20 0857 0     Pain Loc --      Pain Edu? --      Excl. in GC? --    No data found.  Updated Vital Signs BP 140/80 (BP Location: Right Arm)   Pulse 98   Temp 99.1 F (37.3 C) (Oral)   Resp 18   LMP 03/28/2020   SpO2 100%   Visual Acuity Right Eye Distance:   Left Eye Distance:   Bilateral Distance:    Right Eye Near:   Left Eye Near:  Bilateral Near:     Physical Exam Vitals and nursing note reviewed.  Constitutional:      Appearance: Normal appearance.  HENT:     Head: Atraumatic.     Right Ear: Tympanic membrane and external ear normal.     Left Ear: Tympanic membrane and external ear normal.     Nose: Congestion present.     Mouth/Throat:     Mouth: Mucous membranes are moist.     Pharynx: Oropharynx is clear. No posterior oropharyngeal erythema.  Eyes:     Extraocular Movements: Extraocular movements intact.     Conjunctiva/sclera: Conjunctivae normal.  Cardiovascular:     Rate and Rhythm: Normal rate and regular rhythm.     Heart sounds: Normal heart sounds.  Pulmonary:     Effort: Pulmonary effort is normal.     Breath sounds: Normal breath sounds. No wheezing or rales.  Abdominal:     General: Bowel sounds are normal. There is no distension.     Palpations: Abdomen is soft.     Tenderness: There is no abdominal tenderness. There is no right CVA tenderness, left CVA tenderness or guarding.  Musculoskeletal:        General: Normal range of motion.     Cervical back:  Normal range of motion and neck supple.  Skin:    General: Skin is warm and dry.  Neurological:     Mental Status: She is alert and oriented to person, place, and time.  Psychiatric:        Mood and Affect: Mood normal.        Thought Content: Thought content normal.      UC Treatments / Results  Labs (all labs ordered are listed, but only abnormal results are displayed) Labs Reviewed  SARS CORONAVIRUS 2 (TAT 6-24 HRS)    EKG   Radiology No results found.  Procedures Procedures (including critical care time)  Medications Ordered in UC Medications - No data to display  Initial Impression / Assessment and Plan / UC Course  I have reviewed the triage vital signs and the nursing notes.  Pertinent labs & imaging results that were available during my care of the patient were reviewed by me and considered in my medical decision making (see chart for details).     Await COVID 19 results, isolation protocols reviewed for this. Note given stating she is awaiting test results. Discussed OTC remedies and supportive care, return precautions reviewed at length with patient agreeing to plan.   Final Clinical Impressions(s) / UC Diagnoses   Final diagnoses:  Viral URI   Discharge Instructions   None    ED Prescriptions    None     PDMP not reviewed this encounter.   Roosvelt Maser [place], [place] 04/09/20 [phone]

## 2020-04-10 ENCOUNTER — Telehealth: Payer: Self-pay | Admitting: Infectious Diseases

## 2020-04-10 NOTE — Telephone Encounter (Signed)
Called to Discuss with patient about Covid symptoms and the use of the monoclonal antibody infusion for those with mild to moderate Covid symptoms and at a high risk of hospitalization.     Pt appears to qualify for this infusion due to co-morbid conditions and/or a member of an at-risk group in accordance with the FDA Emergency Use Authorization.    Her body aches are better with the ibuprofen. Some altered sensation in sense of smell.   Estimated body mass index is 26.3 kg/m as calculated from the following:   Height as of 02/02/20: 5\' 8"  (1.727 m).   Weight as of 01/13/20: 173 lb (78.5 kg).  She also has high SVI score - will give her information and allow her time to read and make decision. Sx started 9/04 and eligible for infusion through 9/14

## 2020-05-07 ENCOUNTER — Encounter: Payer: Self-pay | Admitting: Emergency Medicine

## 2020-05-07 ENCOUNTER — Other Ambulatory Visit: Payer: Self-pay

## 2020-05-07 ENCOUNTER — Ambulatory Visit (INDEPENDENT_AMBULATORY_CARE_PROVIDER_SITE_OTHER): Payer: Commercial Managed Care - PPO | Admitting: Emergency Medicine

## 2020-05-07 VITALS — BP 136/70 | HR 85 | Temp 97.6°F | Resp 18 | Ht 68.0 in | Wt 178.2 lb

## 2020-05-07 DIAGNOSIS — Z1159 Encounter for screening for other viral diseases: Secondary | ICD-10-CM | POA: Diagnosis not present

## 2020-05-07 DIAGNOSIS — Z1231 Encounter for screening mammogram for malignant neoplasm of breast: Secondary | ICD-10-CM

## 2020-05-07 DIAGNOSIS — Z1211 Encounter for screening for malignant neoplasm of colon: Secondary | ICD-10-CM

## 2020-05-07 DIAGNOSIS — Z7689 Persons encountering health services in other specified circumstances: Secondary | ICD-10-CM

## 2020-05-07 DIAGNOSIS — K429 Umbilical hernia without obstruction or gangrene: Secondary | ICD-10-CM

## 2020-05-07 NOTE — Progress Notes (Signed)
Shelly Turner 54 y.o.   Chief Complaint  Patient presents with  . New Patient (Initial Visit)    Patient is here to establish care and would like to discuss Hernia that she has had for awhile but is starting to become painful again.    HISTORY OF PRESENT ILLNESS: This is a 54 y.o. female first visit to this office, here to establish care with me. No significant past medical history.  No chronic medical problems. Has history of umbilical hernia.  Needs surgical referral.  Denies pain, nausea or vomiting, or any associated symptoms. Health maintenance items discussed with patient, including need for mammogram and colonoscopy. Had Covid infection early last month, September.  Recovering well.  Asymptomatic at present time.  No complications. Has forms from work that need filled out. No other complaints or medical concerns today.  HPI   Prior to Admission medications   Medication Sig Start Date End Date Taking? Authorizing Provider  gabapentin (NEURONTIN) 100 MG capsule Take 1 capsule (100 mg total) by mouth at bedtime. 02/02/20  Yes Shelly Ancona, MD  meloxicam (MOBIC) 15 MG tablet Take 1 tablet (15 mg total) by mouth daily. 01/13/20  Yes Shelly Moore, MD  Multiple Vitamin (MULTIVITAMIN WITH MINERALS) TABS Take 1 tablet by mouth daily.   Yes [provider]  tiZANidine (ZANAFLEX) 4 MG tablet Take 1-2 tablets (4-8 mg total) by mouth every 6 (six) hours as needed for muscle spasms. 01/13/20  Yes Shelly Moore, MD  methylPREDNISolone (MEDROL DOSEPAK) 4 MG TBPK tablet TAD Patient not taking: Reported on 02/02/2020 01/13/20   Shelly Moore, MD    Allergies  Allergen Reactions  . Shellfish Allergy Shortness Of Breath and Swelling    There are no problems to display for this patient.   Past Medical History:  Diagnosis Date  . Anemia    Phreesia 05/06/2020  . Heart murmur    [employer]reesia 05/06/2020  . UTI (lower urinary tract infection)     Past Surgical  History:  Procedure Laterality Date  . CESAREAN SECTION    . CESAREAN SECTION N/A    [employer]reesia 05/06/2020    Social History   Socioeconomic History  . Marital status: Single    Spouse name: Not on file  . Number of children: Not on file  . Years of education: Not on file  . Highest education level: Not on file  Occupational History  . Not on file  Tobacco Use  . Smoking status: Current Every Day Smoker    Packs/day: 0.50  . Smokeless tobacco: Never Used  Substance and Sexual Activity  . Alcohol use: Yes    Comment: occas  . Drug use: No  . Sexual activity: Yes    Birth control/protection: None  Other Topics Concern  . Not on file  Social History Narrative  . Not on file   Social Determinants of Health   Financial Resource Strain:   . Difficulty of Paying Living Expenses: Not on file  Food Insecurity:   . Worried About Programme researcher, broadcasting/film/video in the Last Year: Not on file  . Ran Out of Food in the Last Year: Not on file  Transportation Needs:   . Lack of Transportation (Medical): Not on file  . Lack of Transportation (Non-Medical): Not on file  Physical Activity:   . Days of Exercise per Week: Not on file  . Minutes of Exercise per Session: Not on file  Stress:   . Feeling of Stress :  Not on file  Social Connections:   . Frequency of Communication with Friends and Family: Not on file  . Frequency of Social Gatherings with Friends and Family: Not on file  . Attends Religious Services: Not on file  . Active Member of Clubs or Organizations: Not on file  . Attends Banker Meetings: Not on file  . Marital Status: Not on file  Intimate Partner Violence:   . Fear of Current or Ex-Partner: Not on file  . Emotionally Abused: Not on file  . Physically Abused: Not on file  . Sexually Abused: Not on file    Family History  Problem Relation Age of Onset  . Asthma Other   . Asthma Mother   . Healthy Father      Review of Systems  Constitutional:  Negative.  Negative for chills and fever.  HENT: Negative.  Negative for congestion and sore throat.   Respiratory: Negative.  Negative for cough and shortness of breath.   Cardiovascular: Negative.  Negative for chest pain and palpitations.  Genitourinary: Negative.  Negative for dysuria and hematuria.  Skin: Negative.  Negative for rash.  Neurological: Negative.  Negative for dizziness and headaches.  All other systems reviewed and are negative.  Vitals:   05/07/20 1611  BP: 136/70  Pulse: 85  Resp: 18  Temp: 97.6 F (36.4 C)  SpO2: 99%     Physical Exam Vitals reviewed.  Constitutional:      Appearance: Normal appearance.  HENT:     Head: Normocephalic.  Eyes:     Extraocular Movements: Extraocular movements intact.     Pupils: Pupils are equal, round, and reactive to light.  Cardiovascular:     Rate and Rhythm: Normal rate and regular rhythm.     Pulses: Normal pulses.     Heart sounds: Normal heart sounds.  Pulmonary:     Effort: Pulmonary effort is normal.     Breath sounds: Normal breath sounds.  Abdominal:     General: Bowel sounds are normal. There is no distension.     Palpations: Abdomen is soft.     Tenderness: There is no abdominal tenderness.     Hernia: A hernia (Umbilical) is present.  Musculoskeletal:        General: Normal range of motion.     Cervical back: Normal range of motion and neck supple.  Skin:    General: Skin is warm and dry.  Neurological:     General: No focal deficit present.     Mental Status: She is alert and oriented to person, place, and time.  Psychiatric:        Mood and Affect: Mood normal.        Behavior: Behavior normal.      ASSESSMENT & PLAN: Shelly Turner was seen today for new patient (initial visit).  Diagnoses and all orders for this visit:  Umbilical hernia without obstruction and without gangrene -     Ambulatory referral to General Surgery  Colon cancer screening -     Ambulatory referral to  Gastroenterology  Encounter for screening mammogram for malignant neoplasm of breast -     MM Digital Diagnostic Bilat; Future  Need for hepatitis C screening test -     Hepatitis C antibody  Encounter to establish care    Patient Instructions       If you have lab work done today you will be contacted with your lab results within the next 2 weeks.  If you  have not heard from [place] then please contact us. The fastest way to get your results is to register for My Chart.   IF you received an x-ray today, you will receive an invoice from [facility]. Please contact [facility] at [phone] with questions or concerns regarding your invoice.   IF you received labwork today, you will receive an invoice from [facility]. Please contact [facility] at [phone] with questions or concerns regarding your invoice.   Our billing staff will not be able to assist you with questions regarding bills from these companies.  You will be contacted with the lab results as soon as they are available. The fastest way to get your results is to activate your My Chart account. Instructions are located on the last page of this paperwork. If you have not heard from [place] regarding the results in 2 weeks, please contact this office.      Health Maintenance, Female Adopting a healthy lifestyle and getting preventive care are important in promoting health and wellness. Ask your health care provider about:  The right schedule for you to have regular tests and exams.  Things you can do on your own to prevent diseases and keep yourself healthy. What should I know about diet, weight, and exercise? Eat a healthy diet   Eat a diet that includes plenty of vegetables, fruits, low-fat dairy products, and lean protein.  Do not eat a lot of foods that are high in solid fats, added sugars, or sodium. Maintain a healthy weight Body mass index (BMI) is used to identify weight problems. It estimates body  fat based on height and weight. Your health care provider can help determine your BMI and help you achieve or maintain a healthy weight. Get regular exercise Get regular exercise. This is one of the most important things you can do for your health. Most adults should:  Exercise for at least 150 minutes each week. The exercise should increase your heart rate and make you sweat (moderate-intensity exercise).  Do strengthening exercises at least twice a week. This is in addition to the moderate-intensity exercise.  Spend less time sitting. Even light physical activity can be beneficial. Watch cholesterol and blood lipids Have your blood tested for lipids and cholesterol at 54 years of age, then have this test every 5 years. Have your cholesterol levels checked more often if:  Your lipid or cholesterol levels are high.  You are older than 53 years of age.  You are at high risk for heart disease. What should I know about cancer screening? Depending on your health history and family history, you may need to have cancer screening at various ages. This may include screening for:  Breast cancer.  Cervical cancer.  Colorectal cancer.  Skin cancer.  Lung cancer. What should I know about heart disease, diabetes, and high blood pressure? Blood pressure and heart disease  High blood pressure causes heart disease and increases the risk of stroke. This is more likely to develop in people who have high blood pressure readings, are of African descent, or are overweight.  Have your blood pressure checked: ? Every 3-5 years if you are 53-43 years of age. ? Every year if you are 35 years old or older. Diabetes Have regular diabetes screenings. This checks your fasting blood sugar level. Have the screening done:  Once every three years after age 39 if you are at a normal weight and have a low risk for diabetes.  More often and at a younger  age if you are overweight or have a high risk for  diabetes. What should I know about preventing infection? Hepatitis B If you have a higher risk for hepatitis B, you should be screened for this virus. Talk with your health care provider to find out if you are at risk for hepatitis B infection. Hepatitis C Testing is recommended for:  Everyone born from [birth year] through 1964.  Anyone with known risk factors for hepatitis C. Sexually transmitted infections (STIs)  Get screened for STIs, including gonorrhea and chlamydia, if: ? You are sexually active and are younger than 53 years of age. ? You are older than 53 years of age and your health care provider tells you that you are at risk for this type of infection. ? Your sexual activity has changed since you were last screened, and you are at increased risk for chlamydia or gonorrhea. Ask your health care provider if you are at risk.  Ask your health care provider about whether you are at high risk for HIV. Your health care provider may recommend a prescription medicine to help prevent HIV infection. If you choose to take medicine to prevent HIV, you should first get tested for HIV. You should then be tested every 3 months for as long as you are taking the medicine. Pregnancy  If you are about to stop having your period (premenopausal) and you may become pregnant, seek counseling before you get pregnant.  Take 400 to 800 micrograms (mcg) of folic acid every day if you become pregnant.  Ask for birth control (contraception) if you want to prevent pregnancy. Osteoporosis and menopause Osteoporosis is a disease in which the bones lose minerals and strength with aging. This can result in bone fractures. If you are 77 years old or older, or if you are at risk for osteoporosis and fractures, ask your health care provider if you should:  Be screened for bone loss.  Take a calcium or vitamin D supplement to lower your risk of fractures.  Be given hormone replacement therapy (HRT) to treat symptoms of  menopause. Follow these instructions at home: Lifestyle  Do not use any products that contain nicotine or tobacco, such as cigarettes, e-cigarettes, and chewing tobacco. If you need help quitting, ask your health care provider.  Do not use street drugs.  Do not share needles.  Ask your health care provider for help if you need support or information about quitting drugs. Alcohol use  Do not drink alcohol if: ? Your health care provider tells you not to drink. ? You are pregnant, may be pregnant, or are planning to become pregnant.  If you drink alcohol: ? Limit how much you use to 0-1 drink a day. ? Limit intake if you are breastfeeding.  Be aware of how much alcohol is in your drink. In the [place] one drink equals one 12 oz bottle of beer (355 mL), one 5 oz glass of wine (148 mL), or one 1 oz glass of hard liquor (44 mL). General instructions  Schedule regular health, dental, and eye exams.  Stay current with your vaccines.  Tell your health care provider if: ? You often feel depressed. ? You have ever been abused or do not feel safe at home. Summary  Adopting a healthy lifestyle and getting preventive care are important in promoting health and wellness.  Follow your health care provider's instructions about healthy diet, exercising, and getting tested or screened for diseases.  Follow your health care provider's instructions on  monitoring your cholesterol and blood pressure. This information is not intended to replace advice given to you by your health care provider. Make sure you discuss any questions you have with your health care provider. Document Revised: 07/14/2018 Document Reviewed: 07/14/2018 Elsevier Patient Education  2020 Else[employer] Inc.  Umbilical Hernia, Adult  A hernia is a bulge of tissue that pushes through an opening between muscles. An umbilical hernia happens in the abdomen, near the belly button (umbilicus). The hernia may contain tissues from the  small intestine, large intestine, or fatty tissue covering the intestines (omentum). Umbilical hernias in adults tend to get worse over time, and they require surgical treatment. There are several types of umbilical hernias. You may have:  A hernia located just above or below the umbilicus (indirect hernia). This is the most common type of umbilical hernia in adults.  A hernia that forms through an opening formed by the umbilicus (direct hernia).  A hernia that comes and goes (reducible hernia). A reducible hernia may be visible only when you strain, lift something heavy, or cough. This type of hernia can be pushed back into the abdomen (reduced).  A hernia that traps abdominal tissue inside the hernia (incarcerated hernia). This type of hernia cannot be reduced.  A hernia that cuts off blood flow to the tissues inside the hernia (strangulated hernia). The tissues can start to die if this happens. This type of hernia requires emergency treatment. What are the causes? An umbilical hernia happens when tissue inside the abdomen presses on a weak area of the abdominal muscles. What increases the risk? You may have a greater risk of this condition if you:  Are obese.  Have had several pregnancies.  Have a buildup of fluid inside your abdomen (ascites).  Have had surgery that weakens the abdominal muscles. What are the signs or symptoms? The main symptom of this condition is a painless bulge at or near the belly button. A reducible hernia may be visible only when you strain, lift something heavy, or cough. Other symptoms may include:  Dull pain.  A feeling of pressure. Symptoms of a strangulated hernia may include:  Pain that gets increasingly worse.  Nausea and vomiting.  Pain when pressing on the hernia.  Skin over the hernia becoming red or purple.  Constipation.  Blood in the stool. How is this diagnosed? This condition may be diagnosed based on:  A physical exam. You may  be asked to cough or strain while standing. These actions increase the pressure inside your abdomen and force the hernia through the opening in your muscles. Your health care provider may try to reduce the hernia by pressing on it.  Your symptoms and medical history. How is this treated? Surgery is the only treatment for an umbilical hernia. Surgery for a strangulated hernia is done as soon as possible. If you have a small hernia that is not incarcerated, you may need to lose weight before having surgery. Follow these instructions at home:  Lose weight, if told by your health care provider.  Do not try to push the hernia back in.  Watch your hernia for any changes in color or size. Tell your health care provider if any changes occur.  You may need to avoid activities that increase pressure on your hernia.  Do not lift anything that is heavier than 10 lb (4.5 kg) until your health care provider says that this is safe.  Take over-the-counter and prescription medicines only as told by your health  care provider.  Keep all follow-up visits as told by your health care provider. This is important. Contact a health care provider if:  Your hernia gets larger.  Your hernia becomes painful. Get help right away if:  You develop sudden, severe pain near the area of your hernia.  You have pain as well as nausea or vomiting.  You have pain and the skin over your hernia changes color.  You develop a fever. This information is not intended to replace advice given to you by your health care provider. Make sure you discuss any questions you have with your health care provider. Document Revised: 09/02/2017 Document Reviewed: 01/19/2017 Elsevier Patient Education  2020 Elsevier Inc.      Edwina Barth, MD [facility]

## 2020-05-07 NOTE — Patient Instructions (Addendum)
   If you have lab work done today you will be contacted with your lab results within the next 2 weeks.  If you have not heard from us then please contact us. The fastest way to get your results is to register for My Chart.   IF you received an x-ray today, you will receive an invoice from [facility]. Please contact [facility] at [phone] with questions or concerns regarding your invoice.   IF you received labwork today, you will receive an invoice from [facility]. Please contact [facility] at [phone] with questions or concerns regarding your invoice.   Our billing staff will not be able to assist you with questions regarding bills from these companies.  You will be contacted with the lab results as soon as they are available. The fastest way to get your results is to activate your My Chart account. Instructions are located on the last page of this paperwork. If you have not heard from us regarding the results in 2 weeks, please contact this office.      Health Maintenance, Female Adopting a healthy lifestyle and getting preventive care are important in promoting health and wellness. Ask your health care provider about:  The right schedule for you to have regular tests and exams.  Things you can do on your own to prevent diseases and keep yourself healthy. What should I know about diet, weight, and exercise? Eat a healthy diet   Eat a diet that includes plenty of vegetables, fruits, low-fat dairy products, and lean protein.  Do not eat a lot of foods that are high in solid fats, added sugars, or sodium. Maintain a healthy weight Body mass index (BMI) is used to identify weight problems. It estimates body fat based on height and weight. Your health care provider can help determine your BMI and help you achieve or maintain a healthy weight. Get regular exercise Get regular exercise. This is one of the most important things you can do for your health. Most  adults should:  Exercise for at least 150 minutes each week. The exercise should increase your heart rate and make you sweat (moderate-intensity exercise).  Do strengthening exercises at least twice a week. This is in addition to the moderate-intensity exercise.  Spend less time sitting. Even light physical activity can be beneficial. Watch cholesterol and blood lipids Have your blood tested for lipids and cholesterol at 53 years of age, then have this test every 5 years. Have your cholesterol levels checked more often if:  Your lipid or cholesterol levels are high.  You are older than 53 years of age.  You are at high risk for heart disease. What should I know about cancer screening? Depending on your health history and family history, you may need to have cancer screening at various ages. This may include screening for:  Breast cancer.  Cervical cancer.  Colorectal cancer.  Skin cancer.  Lung cancer. What should I know about heart disease, diabetes, and high blood pressure? Blood pressure and heart disease  High blood pressure causes heart disease and increases the risk of stroke. This is more likely to develop in people who have high blood pressure readings, are of African descent, or are overweight.  Have your blood pressure checked: ? Every 3-5 years if you are 18-39 years of age. ? Every year if you are 40 years old or older. Diabetes Have regular diabetes screenings. This checks your fasting blood sugar level. Have the screening done:  Once every   three years after age 40 if you are at a normal weight and have a low risk for diabetes.  More often and at a younger age if you are overweight or have a high risk for diabetes. What should I know about preventing infection? Hepatitis B If you have a higher risk for hepatitis B, you should be screened for this virus. Talk with your health care provider to find out if you are at risk for hepatitis B infection. Hepatitis  C Testing is recommended for:  Everyone born from 1945 through 1965.  Anyone with known risk factors for hepatitis C. Sexually transmitted infections (STIs)  Get screened for STIs, including gonorrhea and chlamydia, if: ? You are sexually active and are younger than 53 years of age. ? You are older than 53 years of age and your health care provider tells you that you are at risk for this type of infection. ? Your sexual activity has changed since you were last screened, and you are at increased risk for chlamydia or gonorrhea. Ask your health care provider if you are at risk.  Ask your health care provider about whether you are at high risk for HIV. Your health care provider may recommend a prescription medicine to help prevent HIV infection. If you choose to take medicine to prevent HIV, you should first get tested for HIV. You should then be tested every 3 months for as long as you are taking the medicine. Pregnancy  If you are about to stop having your period (premenopausal) and you may become pregnant, seek counseling before you get pregnant.  Take 400 to 800 micrograms (mcg) of folic acid every day if you become pregnant.  Ask for birth control (contraception) if you want to prevent pregnancy. Osteoporosis and menopause Osteoporosis is a disease in which the bones lose minerals and strength with aging. This can result in bone fractures. If you are 65 years old or older, or if you are at risk for osteoporosis and fractures, ask your health care provider if you should:  Be screened for bone loss.  Take a calcium or vitamin D supplement to lower your risk of fractures.  Be given hormone replacement therapy (HRT) to treat symptoms of menopause. Follow these instructions at home: Lifestyle  Do not use any products that contain nicotine or tobacco, such as cigarettes, e-cigarettes, and chewing tobacco. If you need help quitting, ask your health care provider.  Do not use street  drugs.  Do not share needles.  Ask your health care provider for help if you need support or information about quitting drugs. Alcohol use  Do not drink alcohol if: ? Your health care provider tells you not to drink. ? You are pregnant, may be pregnant, or are planning to become pregnant.  If you drink alcohol: ? Limit how much you use to 0-1 drink a day. ? Limit intake if you are breastfeeding.  Be aware of how much alcohol is in your drink. In the [place] one drink equals one 12 oz bottle of beer (355 mL), one 5 oz glass of wine (148 mL), or one 1 oz glass of hard liquor (44 mL). General instructions  Schedule regular health, dental, and eye exams.  Stay current with your vaccines.  Tell your health care provider if: ? You often feel depressed. ? You have ever been abused or do not feel safe at home. Summary  Adopting a healthy lifestyle and getting preventive care are important in promoting health and wellness.    Follow your health care provider's instructions about healthy diet, exercising, and getting tested or screened for diseases.  Follow your health care provider's instructions on monitoring your cholesterol and blood pressure. This information is not intended to replace advice given to you by your health care provider. Make sure you discuss any questions you have with your health care provider. Document Revised: 07/14/2018 Document Reviewed: 07/14/2018 Elsevier Patient Education  2020 [employer]  Umbilical Hernia, Adult  A hernia is a bulge of tissue that pushes through an opening between muscles. An umbilical hernia happens in the abdomen, near the belly button (umbilicus). The hernia may contain tissues from the small intestine, large intestine, or fatty tissue covering the intestines (omentum). Umbilical hernias in adults tend to get worse over time, and they require surgical treatment. There are several types of umbilical hernias. You may have:  A hernia  located just above or below the umbilicus (indirect hernia). This is the most common type of umbilical hernia in adults.  A hernia that forms through an opening formed by the umbilicus (direct hernia).  A hernia that comes and goes (reducible hernia). A reducible hernia may be visible only when you strain, lift something heavy, or cough. This type of hernia can be pushed back into the abdomen (reduced).  A hernia that traps abdominal tissue inside the hernia (incarcerated hernia). This type of hernia cannot be reduced.  A hernia that cuts off blood flow to the tissues inside the hernia (strangulated hernia). The tissues can start to die if this happens. This type of hernia requires emergency treatment. What are the causes? An umbilical hernia happens when tissue inside the abdomen presses on a weak area of the abdominal muscles. What increases the risk? You may have a greater risk of this condition if you:  Are obese.  Have had several pregnancies.  Have a buildup of fluid inside your abdomen (ascites).  Have had surgery that weakens the abdominal muscles. What are the signs or symptoms? The main symptom of this condition is a painless bulge at or near the belly button. A reducible hernia may be visible only when you strain, lift something heavy, or cough. Other symptoms may include:  Dull pain.  A feeling of pressure. Symptoms of a strangulated hernia may include:  Pain that gets increasingly worse.  Nausea and vomiting.  Pain when pressing on the hernia.  Skin over the hernia becoming red or purple.  Constipation.  Blood in the stool. How is this diagnosed? This condition may be diagnosed based on:  A physical exam. You may be asked to cough or strain while standing. These actions increase the pressure inside your abdomen and force the hernia through the opening in your muscles. Your health care provider may try to reduce the hernia by pressing on it.  Your symptoms and  medical history. How is this treated? Surgery is the only treatment for an umbilical hernia. Surgery for a strangulated hernia is done as soon as possible. If you have a small hernia that is not incarcerated, you may need to lose weight before having surgery. Follow these instructions at home:  Lose weight, if told by your health care provider.  Do not try to push the hernia back in.  Watch your hernia for any changes in color or size. Tell your health care provider if any changes occur.  You may need to avoid activities that increase pressure on your hernia.  Do not lift anything that is heavier than 10  lb (4.5 kg) until your health care provider says that this is safe.  Take over-the-counter and prescription medicines only as told by your health care provider.  Keep all follow-up visits as told by your health care provider. This is important. Contact a health care provider if:  Your hernia gets larger.  Your hernia becomes painful. Get help right away if:  You develop sudden, severe pain near the area of your hernia.  You have pain as well as nausea or vomiting.  You have pain and the skin over your hernia changes color.  You develop a fever. This information is not intended to replace advice given to you by your health care provider. Make sure you discuss any questions you have with your health care provider. Document Revised: 09/02/2017 Document Reviewed: 01/19/2017 Elsevier Patient Education  2020 [employer].

## 2020-05-08 LAB — HEPATITIS C ANTIBODY: Hep C Virus Ab: <0.1 s/co ratio (ref 0.0–0.9)

## 2020-05-30 ENCOUNTER — Encounter: Payer: Commercial Managed Care - PPO | Admitting: Emergency Medicine

## 2020-09-04 ENCOUNTER — Encounter: Payer: Commercial Managed Care - PPO | Admitting: Emergency Medicine

## 2020-09-05 ENCOUNTER — Encounter: Payer: Self-pay | Admitting: Emergency Medicine

## 2023-08-27 ENCOUNTER — Ambulatory Visit (HOSPITAL_COMMUNITY)
Admission: EM | Admit: 2023-08-27 | Discharge: 2023-08-27 | Disposition: A | Discharge status: Home / Self Care | Payer: Medicaid Other | Attending: Family Medicine | Admitting: Family Medicine

## 2023-08-27 ENCOUNTER — Encounter (HOSPITAL_COMMUNITY): Payer: Self-pay

## 2023-08-27 DIAGNOSIS — J111 Influenza due to unidentified influenza virus with other respiratory manifestations: Secondary | ICD-10-CM | POA: Diagnosis not present

## 2023-08-27 LAB — POC COVID19/FLU A&B COMBO
Covid Antigen, POC: NEGATIVE
Influenza A Antigen, POC: POSITIVE — AB
Influenza B Antigen, POC: NEGATIVE

## 2023-08-27 MED ORDER — ACETAMINOPHEN 325 MG PO TABS
650.0000 mg | ORAL_TABLET | Freq: Once | ORAL | Status: AC
  Administered 2023-08-27: 650 mg via ORAL

## 2023-08-27 MED ORDER — OSELTAMIVIR PHOSPHATE 75 MG PO CAPS: 75.0000 mg | ORAL_CAPSULE | Freq: Two times a day (BID) | ORAL | 0 refills | Status: AC

## 2023-08-27 MED ORDER — ACETAMINOPHEN 325 MG PO TABS
ORAL_TABLET | ORAL | Status: AC
  Filled 2023-08-27: qty 2

## 2023-08-27 NOTE — ED Provider Notes (Signed)
[facility]    CSN: [id number] Arrival date & time: 08/27/23  1006      History   Chief Complaint Chief Complaint  Patient presents with   Cough    HPI Shelly Turner is a 58 y.o. female.    Cough Associated symptoms: chills, fever, myalgias and rhinorrhea   Associated symptoms: no shortness of breath    Patient is here for 2 days of cough, body aches, headache, chest congestion, chills.  Fever today.  Taking otc meds with some help.  No sob, no wheezing.  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        Past Medical History:  Diagnosis Date   Anemia    [employer] 05/06/2020   Heart murmur    [employer] 05/06/2020   UTI (lower urinary tract infection)     There are no active problems to display for this patient.   Past Surgical History:  Procedure Laterality Date   CESAREAN SECTION     CESAREAN SECTION N/A    [employer] 05/06/2020    OB History   No obstetric history on file.      Home Medications    Prior to Admission medications   Medication Sig Start Date End Date Taking? Authorizing Provider  Multiple Vitamin (MULTIVITAMIN WITH MINERALS) TABS Take 1 tablet by mouth daily.    [provider]    Family History Family History  Problem Relation Age of Onset   Asthma Other    Asthma Mother    Healthy Father     Social History Social History   Tobacco Use   Smoking status: Every Day    Current packs/day: 0.50    Types: Cigarettes   Smokeless tobacco: Never  Substance Use Topics   Alcohol use: Yes    Comment: occas   Drug use: No     Allergies   Shellfish allergy   Review of Systems Review of Systems  Constitutional:  Positive for chills, fatigue and fever.  HENT:  Positive for congestion and rhinorrhea.   Respiratory:  Positive for cough. Negative for shortness of breath.   Gastrointestinal: Negative.   Genitourinary: Negative.   Musculoskeletal:  Positive for arthralgias  and myalgias.  Psychiatric/Behavioral: Negative.       Physical Exam Triage Vital Signs ED Triage Vitals  Encounter Vitals Group     BP 08/27/23 1116 (!) 177/98     Systolic BP Percentile --      Diastolic BP Percentile --      Pulse Rate 08/27/23 1116 88     Resp 08/27/23 1116 18     Temp 08/27/23 1116 (!) 100.7 F (38.2 C)     Temp Source 08/27/23 1116 Oral     SpO2 08/27/23 1116 94 %     Weight --      Height --      Head Circumference --      Peak Flow --      Pain Score 08/27/23 1117 8     Pain Loc --      Pain Education --      Exclude from Growth Chart --    No data found.  Updated Vital Signs BP (!) 177/98 (BP Location: Left Arm)   Pulse 88   Temp (!) 100.7 F (38.2 C) (Oral)   Resp 18   LMP 05/05/2020   SpO2 94%   Visual Acuity Right Eye Distance:   Left Eye  Distance:   Bilateral Distance:    Right Eye Near:   Left Eye Near:    Bilateral Near:     Physical Exam Constitutional:      General: She is not in acute distress.    Appearance: Normal appearance. She is normal weight. She is ill-appearing. She is not toxic-appearing.  HENT:     Nose: Nose normal.     Mouth/Throat:     Mouth: Mucous membranes are moist.  Cardiovascular:     Rate and Rhythm: Normal rate and regular rhythm.  Pulmonary:     Effort: Pulmonary effort is normal.     Breath sounds: Normal breath sounds. No wheezing or rhonchi.  Musculoskeletal:        General: Normal range of motion.     Cervical back: Neck supple. No tenderness.  Lymphadenopathy:     Cervical: No cervical adenopathy.  Skin:    General: Skin is warm.  Neurological:     General: No focal deficit present.     Mental Status: She is alert.  Psychiatric:        Mood and Affect: Mood normal.      UC Treatments / Results  Labs (all labs ordered are listed, but only abnormal results are displayed) Labs Reviewed  POC COVID19/FLU A&B COMBO - Abnormal; Notable for the following components:      Result  Value   Influenza A Antigen, POC Positive (*)    All other components within normal limits    EKG   Radiology No results found.  Procedures Procedures (including critical care time)  Medications Ordered in UC Medications - No data to display  Initial Impression / Assessment and Plan / UC Course  I have reviewed the triage vital signs and the nursing notes.  Pertinent labs & imaging results that were available during my care of the patient were reviewed by me and considered in my medical decision making (see chart for details).   Final Clinical Impressions(s) / UC Diagnoses   Final diagnoses:  Influenza with respiratory manifestation     Discharge Instructions      You were diagnosed with Flu A today.  Covid was negative.  I have sent out tamiflu to start as soon as possible.  Please use tylenol/motrin for pain/fever.  Get plenty of rest and fluids.  Return if you are not improving or worsening.     ED Prescriptions     Medication Sig Dispense Auth. Provider   oseltamivir (TAMIFLU) 75 MG capsule Take 1 capsule (75 mg total) by mouth every 12 (twelve) hours. 10 capsule Jannifer Franklin, MD      PDMP not reviewed this encounter.   Jannifer Franklin, MD 08/27/23 1155

## 2023-08-27 NOTE — ED Triage Notes (Signed)
Pt c/o cough, headache, body aches, and chest congestion x2 days. States taking cold/flu and tylenol with little relief.

## 2023-08-27 NOTE — Discharge Instructions (Signed)
You were diagnosed with Flu A today.  Covid was negative.  I have sent out tamiflu to start as soon as possible.  Please use tylenol/motrin for pain/fever.  Get plenty of rest and fluids.  Return if you are not improving or worsening.

## 2024-04-17 ENCOUNTER — Ambulatory Visit
